# Patient Record
Sex: Female | Born: 1955 | Race: White | Hispanic: No | Smoking: Never smoker
Health system: Southern US, Community
[De-identification: ages and names within clinical notes are randomized; demographics above are authoritative.]

## PROBLEM LIST (undated history)

## (undated) DIAGNOSIS — F32A Depression, unspecified: Secondary | ICD-10-CM

## (undated) DIAGNOSIS — I38 Endocarditis, valve unspecified: Secondary | ICD-10-CM

## (undated) DIAGNOSIS — G250 Essential tremor: Secondary | ICD-10-CM

## (undated) DIAGNOSIS — E079 Disorder of thyroid, unspecified: Secondary | ICD-10-CM

## (undated) DIAGNOSIS — F329 Major depressive disorder, single episode, unspecified: Secondary | ICD-10-CM

## (undated) DIAGNOSIS — M3500 Sicca syndrome, unspecified: Secondary | ICD-10-CM

## (undated) DIAGNOSIS — E785 Hyperlipidemia, unspecified: Secondary | ICD-10-CM

## (undated) DIAGNOSIS — G4700 Insomnia, unspecified: Secondary | ICD-10-CM

## (undated) DIAGNOSIS — F419 Anxiety disorder, unspecified: Secondary | ICD-10-CM

## (undated) HISTORY — PX: KNEE SURGERY: SHX244

---

## 2009-12-23 ENCOUNTER — Encounter: Admission: RE | Admit: 2009-12-23 | Discharge: 2009-12-23 | Payer: Self-pay | Admitting: Unknown Physician Specialty

## 2013-08-16 ENCOUNTER — Encounter: Payer: Self-pay | Admitting: *Deleted

## 2013-08-16 ENCOUNTER — Emergency Department (INDEPENDENT_AMBULATORY_CARE_PROVIDER_SITE_OTHER): Payer: BC Managed Care – PPO

## 2013-08-16 ENCOUNTER — Emergency Department
Admission: EM | Admit: 2013-08-16 | Discharge: 2013-08-16 | Disposition: A | Discharge status: Home / Self Care | Payer: BC Managed Care – PPO | Source: Home / Self Care | Attending: Family Medicine | Admitting: Family Medicine

## 2013-08-16 DIAGNOSIS — M7521 Bicipital tendinitis, right shoulder: Secondary | ICD-10-CM

## 2013-08-16 DIAGNOSIS — M7551 Bursitis of right shoulder: Secondary | ICD-10-CM

## 2013-08-16 DIAGNOSIS — M7520 Bicipital tendinitis, unspecified shoulder: Secondary | ICD-10-CM

## 2013-08-16 DIAGNOSIS — M67919 Unspecified disorder of synovium and tendon, unspecified shoulder: Secondary | ICD-10-CM

## 2013-08-16 DIAGNOSIS — M25519 Pain in unspecified shoulder: Secondary | ICD-10-CM

## 2013-08-16 HISTORY — DX: Hyperlipidemia, unspecified: E78.5

## 2013-08-16 MED ORDER — MELOXICAM 7.5 MG PO TABS: 7.5000 mg | ORAL_TABLET | Freq: Every day | ORAL | Status: DC

## 2013-08-16 NOTE — ED Provider Notes (Signed)
CSN: [id number]     Arrival date & time 08/16/13  1655 History   First MD Initiated Contact with Patient 08/16/13 1739     Chief Complaint  Patient presents with  . Shoulder Pain      HPI Comments: Patient has a history of right shoulder pain that flares up every several years.  In the past she has undergone steroid injection and range of motion exercises with improvement.  Her right shoulder pain recurred about two months ago, somewhat worse than in the past.   She recalls no recent trauma to her shoulder.  She is a Financial controller for 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  She is right-handed. Her pain has not responded to Aleve.  Patient is a 57 y.o. female presenting with shoulder pain. The history is provided by the patient.  Shoulder Pain This is a recurrent problem. Episode onset: 2 months ago. The problem occurs constantly. The problem has been gradually worsening. Associated symptoms comments: none. Exacerbated by: reaching overhead. Nothing relieves the symptoms. Treatments tried: Aleve. The treatment provided mild relief.    Past Medical History  Diagnosis Date  . Hyperlipidemia    Past Surgical History  Procedure Laterality Date  . Knee surgery     History reviewed. No pertinent family history. History  Substance Use Topics  . Smoking status: Never Smoker   . Smokeless tobacco: Never Used  . Alcohol Use: Yes   OB History   Grav Para Term Preterm Abortions TAB SAB Ect Mult Living                 Review of Systems  All other systems reviewed and are negative.    Allergies  Review of patient's allergies indicates no known allergies.  Home Medications   Current Outpatient Rx  Name  Route  Sig  Dispense  Refill  . aspirin 81 MG tablet   Oral   Take 81 mg by mouth daily.         Marland Kitchen buPROPion (WELLBUTRIN) 75 MG tablet   Oral   Take 75 mg by mouth 2 (two) times daily.         .  metoprolol tartrate (LOPRESSOR) 25 MG tablet   Oral   Take 25 mg by mouth 2 (two) times daily.         Marland Kitchen venlafaxine (EFFEXOR) 25 MG tablet   Oral   Take 25 mg by mouth 2 (two) times daily.         . meloxicam (MOBIC) 7.5 MG tablet   Oral   Take 1 tablet (7.5 mg total) by mouth daily. Take with food.   15 tablet   0    BP 128/94  Pulse 67  Resp 16  Ht 5\' 4"  (1.626 m)  Wt 149 lb (67.586 kg)  BMI 25.56 kg/m2  SpO2 96% Physical Exam  Nursing note and vitals reviewed. Constitutional: She is oriented to person, place, and time. She appears well-developed and well-nourished. No distress.  HENT:  Head: Normocephalic.  Eyes: Conjunctivae are normal. Pupils are equal, round, and reactive to light.  Neck:  Mild right trapezius tenderness present.  Cardiovascular: Normal heart sounds.   Pulmonary/Chest: Breath sounds normal.  Musculoskeletal:       Right shoulder: She exhibits tenderness. She exhibits normal range of motion, no bony tenderness, no swelling, no crepitus and normal strength.  Patient has generally good range of motion in right  shoulder.  She is able to perform Apley's test slowly.  Good external/internal rotational strength.  Negative empty can test.   There is distinct tenderness over insertion of biceps tendons.  There is tenderness over the sub-acromial bursa.  Hawkin's and Neer's test positive. Distal neurovascular function is intact.   Lymphadenopathy:    She has no cervical adenopathy.  Neurological: She is alert and oriented to person, place, and time.  Skin: Skin is warm and dry. No rash noted.    ED Course  Procedures      Imaging Review Dg Shoulder Right  08/16/2013   *RADIOLOGY REPORT*  Clinical Data: Persistent right shoulder pain for 2 months, intermittently for years, no history of trauma  RIGHT SHOULDER - 2+ VIEW  Comparison: None  Findings: Osseous mineralization grossly normal. Joint spaces preserved. No acute fracture, dislocation, or bone  destruction. Visualized right ribs unremarkable.  IMPRESSION: No acute osseous abnormalities.   Original Report Authenticated By: Ulyses Southward, M.D.    MDM   1. Shoulder bursitis, right   2. Biceps tendonitis, right    Begin Mobic 7.5mg  daily (if this is ineffective after several days, may increase to two daily and switch to Rx for 15mg ) Apply ice pack two or three times daily.  Begin range of motion and stretching exercises as per instruction sheets ([facility] information and instruction handout given)   May take Tylenol at bedtime for pain Followup with Dr. Rodney Langton in two weeks.    Lattie Haw, MD 08/16/13 [phone]

## 2013-08-16 NOTE — ED Notes (Signed)
Pt c/o right shoulder pain x 2 months without injury. She is a Financial controller. Hx of right shoulder pain, never had x-ray. Pain only with activity.

## 2013-08-17 ENCOUNTER — Telehealth: Payer: Self-pay | Admitting: *Deleted

## 2013-08-31 ENCOUNTER — Institutional Professional Consult (permissible substitution): Payer: BC Managed Care – PPO | Admitting: Sports Medicine

## 2014-01-24 ENCOUNTER — Ambulatory Visit (INDEPENDENT_AMBULATORY_CARE_PROVIDER_SITE_OTHER): Payer: 59 | Admitting: Psychiatry

## 2014-01-24 ENCOUNTER — Encounter (INDEPENDENT_AMBULATORY_CARE_PROVIDER_SITE_OTHER): Payer: Self-pay

## 2014-01-24 DIAGNOSIS — F321 Major depressive disorder, single episode, moderate: Secondary | ICD-10-CM

## 2014-01-24 DIAGNOSIS — F411 Generalized anxiety disorder: Secondary | ICD-10-CM | POA: Insufficient documentation

## 2014-01-24 NOTE — Progress Notes (Signed)
   THERAPIST PROGRESS NOTE  Session Time: 10:20 - 11:00 am   Participation Level: Active  Behavioral Response: Casual and NeatAlertAnxious and Depressed  Type of Therapy: Individual Therapy  Treatment Goals addressed: Initial Therapy session and start of psychosocial assessment  Interventions: Started initial assessment.   Summary: Tamara CreteKarianne Caccavale is a 58 y.o. female who presents with severe anxiety and moderate depression symptoms. This is Pts first session with Clinical research associatewriter. Pt arrived twenty minutes late for the session and stated "I am always late". Pt speech and thoughts were accelerated and she required redirection to remain focused and on topic. Pt said she was referred for counseling by her doctor at [facility] (Dr. 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 Pt indicated that she had a "stressful year in 2013 where their were four different deaths of close acquaintances". Pt said they were all younger people that should not have died. Pt said in 2014 her mother died and just prior to her death her bother (age 58) committed suicide through gun shot before they had to make the decision to turn of their mothers life support. Pt was tearful as she talked about it. Pt said she has very high anxiety and rated it Anxiety- 10/10 (0=no anxiety; 5= moderate/tolerable anxiety; 10= panic attacks) and rated her current Depression: 5/10 (0=Very depressed; 5=Neutral; 10=Very Happy). Pt denied thoughts of suicide and said "I could never do that to my father". Pt agreed to come back next week to complete her assessment and asked to have her appointments scheduled thirty minutes prior to the actual start time to ensure she would be on time due to her chronic tardiness. Pt requested writer complete her leave paperwork for her job but due to Pt coming late and the full assessment not being  completed, Clinical research associatewriter did not have enough information to complete the forms. Pt said that since the forms are due this week, she will contact [facility] to see if they can complete them since they have Pts full history.   Suicidal/Homicidal: No  Therapist Response: Introduced self to pt, oriented Pt to the therapeutic process, reviewed no show and cancellation policy, completed a safety assessment to determine no SI, HI or AVH and began psychosocial assessment.   Plan: Return again in 1 week. Finish initial assessment.  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

## 2014-02-01 ENCOUNTER — Ambulatory Visit (INDEPENDENT_AMBULATORY_CARE_PROVIDER_SITE_OTHER): Payer: 59 | Admitting: Psychiatry

## 2014-02-01 DIAGNOSIS — F411 Generalized anxiety disorder: Secondary | ICD-10-CM

## 2014-02-01 DIAGNOSIS — F321 Major depressive disorder, single episode, moderate: Secondary | ICD-10-CM

## 2014-02-02 ENCOUNTER — Telehealth (HOSPITAL_COMMUNITY): Payer: Self-pay | Admitting: Psychiatry

## 2014-02-02 NOTE — Telephone Encounter (Signed)
Writer returned Pts call inquiring about name of the psychiatrist she will be seeing. Pt requests to be put on a cancellation list for him (Dr. Laury DeepPuthuvel) in case their is an earlier appointment than her current scheduled appointment in March. Writer said she would give this request to the front desk staff for follow up.

## 2014-02-02 NOTE — Progress Notes (Signed)
  [facility] Biopsychosocial Assessment  Tamara Cochran 58 y.o. 02/02/2014    Referred by: Dr. Sharee PimpleJudge, PCP  PRESENTING PROBLEM Chief Complaint:  Pt is a 58 year old, Caucasian, employed, divorced, female, referred for counseling by her PCP, Dr. Sharee PimpleJudge for the treatment of anxiety and depression following several deaths over the past two years including the recent death of her mother suicide of her brother in 2014. Pt said her anxiety began increasing during 2013-2014 with numerous deaths of family acquaintances. Pt said "I was attending funeral after funeral" and all of the losses were unnatural losses of younger individuals. Pt then lost her mother due to medical reasons and her brother to suicide in 2014. Pt said it all became "too much". Pt started calling in to work due to not feeling able to travel or function in the stressful environment and sought help from her primary care doctor.  Pt is unmarried without any children and denies and previous mental health history in either an inpatient or outpatient setting. Pts goals are to learn how to manage her anxiety so she can return to work next month.   What are the main stressors in your life right now, how long?  Anxiety, Depression etc. short answers  PREVIOUS MENTAL  HEALTH SERVICES Pt denies  RISK FACTORS FOR SUCIDE Demographic factors: Lives alone, unmarried, no children Current mental status: no suicidal ideation  Loss factors: several deaths including her mother and brother in 2014.  Historical factors: None reported  Risk Reduction factors: Desire to get better and return to her job  Clinical factors: Moderate depression and anxiety  Cognitive features that contribute to risk:  SUICIDE RISK: Minimal: No identifiable suicidal ideation. Patients presenting with no risk factors and  may be classified as minimal risk based on the severity of the depressive symptoms   MEDICAL HISTORY Pt denies any significant medical  history  SOCIAL/FAMILY HISTORY Pt states she was divorced from her only husband in 1999, but they remain friends and the divorce was amicable. Pt said she never had any children. [place].   EDUCATION Some college with a focus in early childhood and family relationships.   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   LEGAL HISTORY Pt denies  SUBSTANCE USE Pt denies  MENTAL STATUS General Appearance /Behavior:  neat, well groomed, friendly Eye Contact: Good Motor Behavior: WNL Speech: normal rate, volume  Level of Consciousness:  WNL Mood: Anxious, depressed Affect: wide range of affect Anxiety Level: moderate Thought Process: Clear, coherent, Thought Content: Normal Perception: Good Judgment: Good Insight: Good Cognition: WNL  DIAGNOSIS  AXIS I  Generalized Anxiety Disorder, Depression, Major, Single Episode, Moderate   PLAN Oriented Pt to the therapeutic process and discussed therapeutic expectations. Pt agrees to weekly counseling to address symptoms of anxiety and depression.  Writer completed Pts FMLA paperwork. Pt is to return to work at the end of March.    Tamara Cochran,Tamara Galla E, LCSW 02/02/2014

## 2014-02-07 ENCOUNTER — Ambulatory Visit (INDEPENDENT_AMBULATORY_CARE_PROVIDER_SITE_OTHER): Payer: 59 | Admitting: Psychiatry

## 2014-02-07 DIAGNOSIS — F411 Generalized anxiety disorder: Secondary | ICD-10-CM

## 2014-02-07 DIAGNOSIS — F321 Major depressive disorder, single episode, moderate: Secondary | ICD-10-CM

## 2014-02-07 NOTE — Progress Notes (Signed)
THERAPIST PROGRESS NOTE  Session Time: 3:00-3:50 pm  Participation Level: Active  Behavioral Response: CasualAlertDepressed  Type of Therapy: Individual Therapy  Treatment Goals addressed: Created treatment plan goals  Interventions: Created treatment plan goals.   Summary:    INDIVIDUAL TREATMENT PLAN       Date of Admission:  01-24-14 Date of Treatment Plan: 02-07-14     Service: [x]  Group  [x]  Individual [x]  Comprehensive []  Revised due to: []  Change in Diagnosis     []  Change in Service     []  Expiration of Previous Treatment Plan  Diagnosis:  Generalized Anxiety Disorder  Recommended Treatment:  [x]  Individual Therapy  []  Family Therapy  [x]  Couples Therapy  [x]  Chemical Dependency (CD-IOP) group therapy 3x weekly and 1:1 individual therapy as required  []  Mental Health (MH-IOP) group therapy 5x weekly and 1:1 individual therapy as required   Possible Negative Outcomes of Treatment: Symptoms of mental illness may increase and changes in relationship can occur during the course of treatment.  Client's Strengths (What are the strengths and resources that will help clients move towards their goals):  Motivated, determined, stable housing and finances   Personal Recovery Goal(s)/Client's Goals for Treatment (use client's words):  To grieve the death of brother and mange symptoms of anxiety and depression.     Objective Behavioral Criteria for Discharge: Client will maintain stability in the community as demonstrated by the following:   No suicidal behaviors for 3 months.   No hospitalizations or emergency room visits for psychological reasons for 3 months.   No SIB behaviors requiring medical attention for 3 months.   Emotional regulation by reporting distress at an average of 5/10 or below for 3 months.   Demonstration of healthy community supports by initiating peer contact at least twice weekly separate from the treatment environment.   Agreed-upon  transition plan to a less restrictive setting.  INFORMED CONSENT TO TREATMENT. I acknowledge that I have been informed of and am able to understand my diagnosis, the nature and purpose of the proposed treatment, the risks and benefits of the proposed treatment, the possible negative outcomes of and possible alternatives to the proposed treatment, the probability that the proposed treatment will be successful, and the prognosis if I choose not to receive the treatment. Further, I have been informed of the extent and limits of confidentiality of treatment information. I understand the risks, benefits, possible negative outcomes of and alternatives to this proposed treatment, and my signature below verifies that I have actively participated in the development of my treatment plan and that I am willingly and voluntarily agreeing to the treatment outlined in this plan Assessed Needs (Problem) Client's Goals (what client will do)   Interventions (what staff will do)   1. Depression    o Have improved mood and return to a healthier level of functioning.   o Identify the causes for depressed mood and learn ways to cope with depression.    o Explore how depression is experienced in day-to-day living; encourage sharing of feelings of depression to gain insight into causes and create a coping plan to manage depression symptoms. o Provide education about depression to help patient identify causes, symptoms and triggers. o Teach and encourage the use of coping skills for management of depression symptoms.  o Teach WRAP skills for depression management.     2. Anxiety     o Improve ability to manage anxiety symptoms and better handle stress.  o Manage thoughts and  worrisome thinking that contribute to feelings of anxiety.  o Complete anxiety assessment (Burns Anxiety Inventory) to explore severity of symptoms. o Provide education about anxiety to help patient identify causes, symptoms and  triggers. o Facilitate problem solution skills to help patient identify and implement options to resolve stressors.  o Teach coping skills to manage anxiety symptoms such as; biofeedback, guided imagery, meditation and aromatherapy. o Use CBT to identify and change anxiety provoking thought patterns.    3. Brothers suicide    o Learn ways to grieve brothers suicide  o Process grief around loss of brother. o Encourage the telling of Pts grief story about his wife. o Identify and educate on the different stages of grief. o Encourage the Pt to bring pictures and memorabilia to help process feelings. o Process feelings of guilt or blame associated with the loss.   I have [] received [] declined a copy of this treatment plan.  Client: Date: Guardian/Parent: Date:   Therapist: Date: Licensed Provider (if applicable): Date:    Six month review:   I have reviewed this treatment plan and consider it still valid. Client: Date: Guardian/Parent: Date:      Suicidal/Homicidal: no  Therapist Response: Assessed overall level of functioning and created treatment plan goals.   Plan: Return again in 1 weeks.  Diagnosis: Axis I: Generalized Anxiety disorder and Major Depression single episode moderate      Tamara Cochran E, LCSW 02/07/2014

## 2014-02-14 ENCOUNTER — Ambulatory Visit (INDEPENDENT_AMBULATORY_CARE_PROVIDER_SITE_OTHER): Payer: 59 | Admitting: Psychiatry

## 2014-02-14 DIAGNOSIS — F411 Generalized anxiety disorder: Secondary | ICD-10-CM

## 2014-02-14 NOTE — Progress Notes (Signed)
   THERAPIST PROGRESS NOTE  Session Time: 4:00-4:50 pm  Participation Level: Active  Behavioral Response: CasualAlertAnxious  Type of Therapy: Individual Therapy  Treatment Goals addressed: Anxiety  Interventions: CBT and Other: Boundaries skills  Summary: Tamara Cochran is a 58 y.o. female who presents with moderate anxiety with both mood and affect. Pt reports getting to her appointment an hour early on accident due to her scattered thinking and feeling disorganized. Pt said she continues to lose things, forget appointments and make mistakes due to her high anxiety. Pt said this is why she does not like leaving her house because she feels less can go wrong when she stays home. Pt said today was a "nightmare" trying to organize her time and all he appointments. Pt gave examples of interactions with friends and family where she has not limits with how she allows others to treat her or take her time. Pt said she spent three hours on the phone with a friend the other day which took her time away from things she needed to do and caused increased anxiety but she was not able to set any limits with the friend. Pt said she tries to please everyone and this is what makes her exhausted and frustrated.  She said she feels mean if she tells people her needs and that comes from her upbringing. Pt was willing to explore ares of her life she feels lacks boundaries and agrees to explore more areas over the next week by evaluating what situations cause her stress to share in the next session.    Suicidal/Homicidal: No  Therapist Response: Assessed overall level of anxiety per Pt self report and behaviors during session. Educated PT on the need for healthier boundaries with others and the lack thereof causing her high anxiety due to no structure in her life. Drew a picture to represent the lack of boundaries. Assigned homework of identifying stressful situations and asking the question "how does this affect me".  Educated Pt about Biofeedback to introduce in the next session.   Plan: Return again in 1 week. Follow up on boundaries homework. Educate on the different personality types and introduce to biofeedback.   Diagnosis: Axis I: Generalized Anxiety Disorder      Carman ChingGLEHORN,Tyke Outman E, LCSW 02/14/2014

## 2014-02-21 ENCOUNTER — Ambulatory Visit (INDEPENDENT_AMBULATORY_CARE_PROVIDER_SITE_OTHER): Payer: 59 | Admitting: Psychiatry

## 2014-02-21 DIAGNOSIS — F321 Major depressive disorder, single episode, moderate: Secondary | ICD-10-CM

## 2014-02-21 DIAGNOSIS — F411 Generalized anxiety disorder: Secondary | ICD-10-CM

## 2014-02-21 NOTE — Progress Notes (Signed)
   THERAPIST PROGRESS NOTE  Session Time: 3:00-3:50 pm  Participation Level: Active  Behavioral Response: CasualAlertAnxious  Type of Therapy: Individual Therapy  Treatment Goals addressed: Anxiety  Interventions: CBT and Other: Boundary Skills  Summary: Tamara CreteKarianne Wooden is a 58 y.o. female who presents with severe anxious mood and affect. Pts thoughts were racing and scattered and she struggled to stay on one topic. Pt expressed concerns at the start of the session about her insurance not covering past visits and said she was working on addressing that with the receptionist today. Pt said she followed upon her homework of exploring situations where healthier boundaries need to be set. Pt have numerous examples of how she has no limits with other people and it is causing her great anxiety. Pt said her neighbors, who are having marital conflict, are using Pt as their counselor to talk about each other. Pt said it is to the point she hides in her home, leaves the garage door down to send the message she is not home and avoids their texts and phone calls. Pt said this is the man she as on the phone with for there hours that she talked about in the last session. During session Pts phone range several times indicating texts from his neighbor. Pt said she struggles with setting limits with others because she has an immense need to be liked by others and feels she is being mean if she sets limits with them. Pt said she also struggles when others set limits with her. Pt said she gets anxious if she sends a text to a friend and they don't respond right away. Pt said her mind starts racing with worries that they don't like her anymore. Pt agreed to make boundaries work her primary focus on Tx given this appears to be Pts primary source of anxiety.   Suicidal/Homicidal: No  Therapist Response: Assessed overall level of anxiety per Pt self report and behaviors during session. Reviewed boundaries homework from  last week. Explored situations that were anxiety provoking that occurred this past week where Pt was uncomfortable setting limits with others. Educated Pt on the need for boundaries as they pertain to anxiety management and practiced identifying ways to begin setting limits in each situation, challenged Pts distorted thinking interfering with healthy boundary setting and assigned homework of reading over the four personality styles to discuss at next weeks session.   Plan: Return again in 1 week. Log Pt onto biofeedback program just to show her anxiety level at baseline. Continue working in teaching healthy bondaries. Educate on the five ways to set limits with others and the common myths about boundary setting.  Diagnosis: Axis I: Generalized Anxiety Disorder      Carman ChingGLEHORN,Kiron Osmun E, LCSW 02/21/2014

## 2014-02-28 ENCOUNTER — Ambulatory Visit (HOSPITAL_COMMUNITY): Payer: Self-pay | Admitting: Psychiatry

## 2014-03-01 ENCOUNTER — Telehealth (HOSPITAL_COMMUNITY): Payer: Self-pay

## 2014-03-07 ENCOUNTER — Ambulatory Visit (HOSPITAL_COMMUNITY): Payer: Self-pay | Admitting: Psychiatry

## 2014-03-13 ENCOUNTER — Encounter (HOSPITAL_COMMUNITY): Payer: Self-pay | Admitting: Psychiatry

## 2014-03-13 ENCOUNTER — Ambulatory Visit (HOSPITAL_COMMUNITY): Payer: Self-pay | Admitting: Psychiatry

## 2014-03-13 NOTE — Progress Notes (Signed)
Patient ID: Tamara CreteKarianne Vilar, female   DOB: 05/06/1956, 58 y.o.   MRN: [id number] Pt called this morning to cancel her 9:00 am appointment saying she "overslept". This is Pts first time missing a therapy appointment with Clinical research associatewriter. Pt was informed of the office attendance policy.

## 2014-03-14 ENCOUNTER — Ambulatory Visit (HOSPITAL_COMMUNITY): Payer: Self-pay | Admitting: Psychiatry

## 2014-03-20 ENCOUNTER — Telehealth (HOSPITAL_COMMUNITY): Payer: Self-pay

## 2014-03-21 ENCOUNTER — Ambulatory Visit (HOSPITAL_COMMUNITY): Payer: Self-pay | Admitting: Psychiatry

## 2014-03-26 ENCOUNTER — Telehealth (HOSPITAL_COMMUNITY): Payer: Self-pay | Admitting: Psychiatry

## 2014-03-26 ENCOUNTER — Telehealth (HOSPITAL_COMMUNITY): Payer: Self-pay

## 2014-03-26 NOTE — Telephone Encounter (Signed)
Writer returned Pts call and left a message for her to call back due to no answer.

## 2014-03-27 NOTE — Telephone Encounter (Signed)
Writer left a message on Pts voicemail stating call was returned and for Pt to feel free to call back or writer would see Pt tomorrow at her scheduled therapy appointment.

## 2014-03-28 ENCOUNTER — Encounter (INDEPENDENT_AMBULATORY_CARE_PROVIDER_SITE_OTHER): Payer: Self-pay

## 2014-03-28 ENCOUNTER — Ambulatory Visit (INDEPENDENT_AMBULATORY_CARE_PROVIDER_SITE_OTHER): Payer: 59 | Admitting: Psychiatry

## 2014-03-28 DIAGNOSIS — F411 Generalized anxiety disorder: Secondary | ICD-10-CM

## 2014-03-28 NOTE — Progress Notes (Signed)
   THERAPIST PROGRESS NOTE  Session Time: 3:00-3:50 pm  Participation Level: Active  Behavioral Response: Casual, Neat and Well GroomedAlertAnxious  Type of Therapy: Individual Therapy  Treatment Goals addressed: Anxiety  Interventions: Other: letter writing   Summary: Tamara Cochran is a 58 y.o. female who presents with anxious mood and affect. The entire session was used to complete an FMLA paperwork update per Pts request. Pt said her forms had expired and she is not feeling ready to return to work due to high anxiety and panic symptoms. Pt was informed that writer would not be providing any further extensions due to going out on maternity leave and that further paperwork would need to be handled by a psychiatrist or doctor. See below for letter that was completed and faxed during session.        Date: March 23, 2014  RE: Tamara Cochran DOB: June 29, 1956 Diagnosis: Anxiety Disorder Fax: [phone]   To Whom It May Concern:  This letter is in regards to Tamara Cochran and her current diagnosis of Anxiety Disorder. She is currently receiving treatment for this condition through the [facility] and is under the medical care of Maxcine HamShannon Sonny Anthes, MSW, LCSW.   Common symptoms of Anxiety Disorder include; difficulty breathing, pounding heart or chest pain, Intense feeling of dread, sensation of choking or smothering, dizziness or feeling faint ,trembling or shaking, sweating, nausea or stomachache, tingling or numbness in the fingers and toes, chills or hot flashes and a fear that the individual is losing control or are about to die.  We are requesting an extension on her FMLA paperwork through May 13, 2014 due to the continued severity of her anxiety symptoms. Thank you so much for your cooperation in this matter.   Sincerely,   Maxcine HamShannon Jhade Berko, MSW, LCSW   ** If you require additional information, please contact the above patient to obtain a written authorization  to release protected health information. Art gallery managerederal regulations and state privacy laws do not permit release of information without a signed authorization. The patient may stop by the Hospital to complete an Authorization to Release Protected Health Information.  Thank you for your cooperation in this regard.     Suicidal/Homicidal:  No  Therapist Response: Assessed overall level of anxiety per Pt self report, completed a letter for an FMLA extension and faxed letter. Gave Pt original copy of successful fax transmission sheet and letter.   Plan: Return again in 1 weeks.  Diagnosis: Axis I: Generalized Anxiety Disorder       Carman ChingGLEHORN,Acacia Latorre E, LCSW 03/28/2014

## 2014-04-04 ENCOUNTER — Ambulatory Visit (INDEPENDENT_AMBULATORY_CARE_PROVIDER_SITE_OTHER): Payer: 59 | Admitting: Psychiatry

## 2014-04-04 DIAGNOSIS — F411 Generalized anxiety disorder: Secondary | ICD-10-CM

## 2014-04-04 DIAGNOSIS — F321 Major depressive disorder, single episode, moderate: Secondary | ICD-10-CM

## 2014-04-04 NOTE — Progress Notes (Signed)
   THERAPIST PROGRESS NOTE  Session Time: 3:00-3:50 pm  Participation Level: Active  Behavioral Response: CasualAlertDepressed  Type of Therapy: Individual Therapy  Treatment Goals addressed: Brothers suicide  Interventions: Supportive and Other: grief and loss counseling  Summary: Anner CreteKarianne Mcgurn is a 58 y.o. female who presents with mild depressed mood and affect in relationship to her brothers suicide. Pt said his birthday was last week and it was a very sad and difficult day for her that she spent thinking of him a lot. Pt said she had thought about cancelling her appointment with writer today because she has been feeling a decrease in motivation and has been socially isolating away from others. Pt processed feelings regarding his death and discussed the suicide note he left behind. Pt said her birthday is next week and she is concerned about how that day will be for her because they always celebrated their birthday together. Pt agreed to discuss this and plan for it at the next session next week. Pt also explored ways to g[place] her [place] [facility] with a [place].    Suicidal/Homicidal: No  Therapist Response: Assessed overall level of depression per Pt self report, encouraged consistent attendance to manage symptoms, provided grief and loss counseling pertaining to her brothers death, processed feelings regarding his loss and educated on symptoms of depression.   Plan: Return again in 1 weeks. Plan for Pts birthday and how to manage grief on that day pertaining to the death of her brother.   Diagnosis: Axis I: Generalized Anxiety Disorder        Carman ChingGLEHORN,Aaban Griep E, LCSW 04/04/2014

## 2014-04-11 ENCOUNTER — Ambulatory Visit (INDEPENDENT_AMBULATORY_CARE_PROVIDER_SITE_OTHER): Payer: 59 | Admitting: Psychiatry

## 2014-04-11 DIAGNOSIS — F321 Major depressive disorder, single episode, moderate: Secondary | ICD-10-CM

## 2014-04-11 DIAGNOSIS — F411 Generalized anxiety disorder: Secondary | ICD-10-CM

## 2014-04-11 NOTE — Progress Notes (Signed)
   THERAPIST PROGRESS NOTE  Session Time: 3:00-3:50 pm  Participation Level: Active  Behavioral Response: Neat and Well GroomedAlertDepressed  Type of Therapy: Individual Therapy  Treatment Goals addressed: Depression and Brothers suicide  Interventions: Other: grief and loss counseling  Summary: Tamara Cochran is a 58 y.o. female who presents with mild depressed mood and affect as Pt talked about her brothers suicide. Pt said she is worried about the month of May due to so many painful first anniversaries after the death of her brother. Pt said her birthday is this Friday an she is worried about how she will feel that day since her and her brother Tamara Loa(Tim) always celebrated their birthdays together. Pt processed feelings associated with her brothers death and described his first suicide attempt he had six weeks prior to his death where his wife found him with a gun in his pocket with plans to go out to the shed and shoot himself. Pt said [facility] and was released. Pt described feelings of loss associated with losing him. Pt also brainstormed how she will grieve and receive support this Friday. Pt agreed to Higher education careers advisercontact writer if symptoms worsen.  Pt also took information on writers maternity leave to determine if she wants a Veterinary surgeoncounselor while Clinical research associatewriter is on maternity leave.   Suicidal/Homicidal:No  Therapist Response: Assessed overall level of depression per Pt self report and provided grief and loss counseling over her brothers suicide. Planned for writers maternity leave.   Plan: Return again in 1 weeks.  Diagnosis: Axis I: Generalized Anxiety Disorder       Carman ChingGLEHORN,Aracely Rickett E, LCSW 04/11/2014

## 2014-04-13 ENCOUNTER — Ambulatory Visit (HOSPITAL_COMMUNITY): Payer: Self-pay | Admitting: Psychiatry

## 2014-04-18 ENCOUNTER — Ambulatory Visit (INDEPENDENT_AMBULATORY_CARE_PROVIDER_SITE_OTHER): Payer: 59 | Admitting: Psychiatry

## 2014-04-18 DIAGNOSIS — F321 Major depressive disorder, single episode, moderate: Secondary | ICD-10-CM

## 2014-04-18 DIAGNOSIS — F411 Generalized anxiety disorder: Secondary | ICD-10-CM

## 2014-04-18 NOTE — Progress Notes (Signed)
   THERAPIST PROGRESS NOTE  Session Time: 3:00-3:50 pm  Participation Level: Active  Behavioral Response: Neat and Well GroomedAlertEuthymic  Type of Therapy: Individual Therapy  Treatment Goals addressed: Depression and Anxiety  Interventions: CBT and Supportive  Summary: Tamara CreteKarianne Corpening is a 58 y.o. female who presents with elevated mood and affect. Pt denied any active depression or anxiety symptoms over the course of the past week. Pt said she had an ok birthday and was able to manage the grief and loss over the celebrating her first birthday without her brother. Pt processed feelings about her birthday and the loss of him.   Suicidal/Homicidal: No   Therapist Response: Assessed overall level of functioning per Pt self report, Processed grief and loss over her brothers death and Pts birthday.   Plan: Return again in 1 weeks.  Diagnosis: Axis I: Generalized Anxiety disorder       Carman ChingGLEHORN,Alida Greiner E, LCSW 04/18/2014

## 2014-04-25 ENCOUNTER — Ambulatory Visit (HOSPITAL_COMMUNITY): Payer: Self-pay | Admitting: Psychiatry

## 2014-05-02 ENCOUNTER — Encounter (INDEPENDENT_AMBULATORY_CARE_PROVIDER_SITE_OTHER): Payer: Self-pay

## 2014-05-02 ENCOUNTER — Ambulatory Visit (INDEPENDENT_AMBULATORY_CARE_PROVIDER_SITE_OTHER): Payer: 59 | Admitting: Psychiatry

## 2014-05-02 DIAGNOSIS — F411 Generalized anxiety disorder: Secondary | ICD-10-CM

## 2014-05-02 NOTE — Progress Notes (Signed)
   THERAPIST PROGRESS NOTE  Session Time: 3:20-4:00 pm  Participation Level: Minimal  Behavioral Response: CasualConfusedAnxious  Type of Therapy: Individual Therapy  Treatment Goals addressed: Anxiety  Interventions: CBT  Summary: Tamara Cochran is a 58 y.o. female who presents twenty minutes late for the session because she was providing phone counseling to her ex-boyfriend and was unable to tell him she needed to hang up the phone and attend her counseling appointment. Pt said it does not matter if she comes on time to the appointment because "writer does not care if Pt comes on time". Pt started the session by saying she did not feel the counseling sessions were working to manage her anxiety and she was not making any progress.  Pt acknowledged that she lacks boundaries for herself that contribute to anxiety, but said it is too hard to change her behaviors because she feels guilty and does not like people angry with her so she has not been making the changes in her life that writer has been suggesting for anxiety reduction. Pts thoughts were disorganized and speech was pressured. Pt said she is not sleeping and believes it is because her Ambien is causing severe dreams that are keeping her awake. Pt said she did not call the doctor to inform them of this and made several excuses as to why that would not work. When Pt was confronted on her negative thought process and how she was listed all the reasons she can't get well and things won't work, Pt seemed confused and struggled to participate in active problem solving. Pt said she feels worse off from work because she can't set healthy limits with friends who need things from her and work provided her the structure in her life she can't provide herself. Pt is scheduled to see the psychiatrist on June 2 and to return to work at the same time. Pt agreed to call her doctor to discuss sleep medication and agreed to come on time for her next counseling  session.   Suicidal/Homicidal: No  Therapist Response: Assessed overall level of anxiety, challenged Pts lateness and linked her lack of personal responsibility in attending and participating in her therapy to the reason she does nto feel she is making progress in her treatment, educated on healthy boundary setting that continues to cause stress in her life, encouraged Pt to return to work since her anxiety was improved while she had the structure of working, encouraged continuing to work with a counseling on boundary setting and structure as a main source of Primary school teacheranxiety management.   Plan: Return again in 1 week.  Diagnosis: Axis I: Generalized Anxiety Disorder        Carman ChingGLEHORN,Tamara Alkhatib E, LCSW 05/02/2014

## 2014-05-09 ENCOUNTER — Ambulatory Visit (HOSPITAL_COMMUNITY): Payer: Self-pay | Admitting: Psychiatry

## 2014-05-15 ENCOUNTER — Encounter (INDEPENDENT_AMBULATORY_CARE_PROVIDER_SITE_OTHER): Payer: Self-pay

## 2014-05-15 ENCOUNTER — Ambulatory Visit (INDEPENDENT_AMBULATORY_CARE_PROVIDER_SITE_OTHER): Payer: 59 | Admitting: Psychiatry

## 2014-05-15 DIAGNOSIS — F411 Generalized anxiety disorder: Secondary | ICD-10-CM

## 2014-05-15 DIAGNOSIS — F329 Major depressive disorder, single episode, unspecified: Secondary | ICD-10-CM

## 2014-05-15 DIAGNOSIS — F321 Major depressive disorder, single episode, moderate: Secondary | ICD-10-CM

## 2014-05-15 MED ORDER — BUPROPION HCL 100 MG PO TABS: 200.0000 mg | ORAL_TABLET | Freq: Every morning | ORAL | Status: DC

## 2014-05-15 MED ORDER — VENLAFAXINE HCL 25 MG PO TABS: 25.0000 mg | ORAL_TABLET | Freq: Two times a day (BID) | ORAL | Status: AC
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
## 2014-05-16 ENCOUNTER — Other Ambulatory Visit (HOSPITAL_COMMUNITY): Payer: Self-pay | Admitting: Psychiatry

## 2014-05-16 MED ORDER — BUPROPION HCL 100 MG PO TABS: 100.0000 mg | ORAL_TABLET | Freq: Every morning | ORAL | Status: DC

## 2014-05-16 MED ORDER — BUPROPION HCL ER (XL) 300 MG PO TB24: 300.0000 mg | ORAL_TABLET | ORAL | Status: DC

## 2014-05-16 NOTE — Progress Notes (Signed)
Patient called and informed she is already on 300mg  XR of wellbutrin. I confirmed with pharmacy. Informed patient to take 300mg  and 100mg  (1 tablet each. Total of 400mg ).

## 2014-06-04 ENCOUNTER — Telehealth (HOSPITAL_COMMUNITY): Payer: Self-pay

## 2014-06-14 ENCOUNTER — Ambulatory Visit (HOSPITAL_COMMUNITY): Payer: Self-pay | Admitting: Psychiatry

## 2018-02-17 DIAGNOSIS — M25511 Pain in right shoulder: Secondary | ICD-10-CM | POA: Diagnosis not present

## 2018-02-17 DIAGNOSIS — R6889 Other general symptoms and signs: Secondary | ICD-10-CM | POA: Diagnosis not present

## 2018-02-24 DIAGNOSIS — E559 Vitamin D deficiency, unspecified: Secondary | ICD-10-CM | POA: Diagnosis not present

## 2018-02-24 DIAGNOSIS — E039 Hypothyroidism, unspecified: Secondary | ICD-10-CM | POA: Diagnosis not present

## 2018-02-24 DIAGNOSIS — E7800 Pure hypercholesterolemia, unspecified: Secondary | ICD-10-CM | POA: Diagnosis not present

## 2018-02-24 DIAGNOSIS — M8580 Other specified disorders of bone density and structure, unspecified site: Secondary | ICD-10-CM | POA: Diagnosis not present

## 2018-02-24 DIAGNOSIS — G479 Sleep disorder, unspecified: Secondary | ICD-10-CM | POA: Diagnosis not present

## 2018-03-04 DIAGNOSIS — M25511 Pain in right shoulder: Secondary | ICD-10-CM | POA: Diagnosis not present

## 2018-03-09 DIAGNOSIS — M25511 Pain in right shoulder: Secondary | ICD-10-CM | POA: Diagnosis not present

## 2018-03-25 ENCOUNTER — Emergency Department (INDEPENDENT_AMBULATORY_CARE_PROVIDER_SITE_OTHER): Payer: BLUE CROSS/BLUE SHIELD

## 2018-03-25 ENCOUNTER — Encounter: Payer: Self-pay | Admitting: Emergency Medicine

## 2018-03-25 ENCOUNTER — Other Ambulatory Visit: Payer: Self-pay

## 2018-03-25 ENCOUNTER — Emergency Department (INDEPENDENT_AMBULATORY_CARE_PROVIDER_SITE_OTHER)
Admission: EM | Admit: 2018-03-25 | Discharge: 2018-03-25 | Disposition: A | Discharge status: Home / Self Care | Payer: Medicare Other | Source: Home / Self Care

## 2018-03-25 DIAGNOSIS — M25561 Pain in right knee: Secondary | ICD-10-CM

## 2018-03-25 HISTORY — DX: Insomnia, unspecified: G47.00

## 2018-03-25 HISTORY — DX: Major depressive disorder, single episode, unspecified: F32.9

## 2018-03-25 HISTORY — DX: Sjogren syndrome, unspecified: M35.00

## 2018-03-25 HISTORY — DX: Anxiety disorder, unspecified: F41.9

## 2018-03-25 HISTORY — DX: Essential tremor: G25.0

## 2018-03-25 HISTORY — DX: Depression, unspecified: F32.A

## 2018-03-25 HISTORY — DX: Disorder of thyroid, unspecified: E07.9

## 2018-03-25 HISTORY — DX: Endocarditis, valve unspecified: I38

## 2018-03-25 MED ORDER — DICLOFENAC SODIUM 75 MG PO TBEC: 75.0000 mg | DELAYED_RELEASE_TABLET | Freq: Two times a day (BID) | ORAL | 2 refills | Status: AC

## 2018-03-25 NOTE — ED Triage Notes (Signed)
Patient fell forward in kitchen 2 nights ago; during fall hit left cheek on refrigerator; top of head was hit by falling vase; landed with most of weight on right knee. Has used ice; not very painful when sitting, but severe pain when standing/walking. Did drive herself to [facility] today. No OTCs or rxs for pain.

## 2018-03-25 NOTE — ED Provider Notes (Signed)
[facility]   [id number] 03/25/18 Arrival Time: 1657   SUBJECTIVE:  Tamara Cochran is a 62 y.o. female who presents to the urgent care with complaint of right knee pain with weightbearing.  Patient fell 2 nights ago on kitchen floor when she was carrying an water bottle to the refrigerator.  She is unsure why she fell.  She was able to get back on her feet after a few minutes.  She realized she had struck her left cheek on the refrigerator door in the face that was on top of her refrigerator landed on her head.  There was no loss of consciousness.  She still little tender over the left cheek.  She also has some tenderness in the left knee but it is not nearly as significant as what she is experiencing in her right knee.  There is been no swelling in the right knee.  She has pain when she bends it or bears weight.  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  She is contemplating a right total shoulder replacement.  Patient drove herself over here and walked in.   Past Medical History:  Diagnosis Date  . Anxiety and depression   . Benign essential tremor   . Heart valve problem   . Hyperlipidemia   . Hyperlipidemia   . Insomnia   . Sjogren's disease (HCC)   . Thyroid disease    No family history on file. Social History   Socioeconomic History  . Marital status: Divorced    Spouse name: Not on file  . Number of children: Not on file  . Years of education: Not on file  . Highest education level: Not on file  Occupational History  . Not on file  Social Needs  . Financial resource strain: Not on file  . Food insecurity:    Worry: Not on file    Inability: Not on file  . Transportation needs:    Medical: Not on file    Non-medical: Not on file  Tobacco Use  . Smoking status: Never Smoker  . Smokeless tobacco: Never Used  Substance and Sexual Activity  . Alcohol use: Yes  . Drug use: Not on file  .  Sexual activity: Not on file  Lifestyle  . Physical activity:    Days per week: Not on file    Minutes per session: Not on file  . Stress: Not on file  Relationships  . Social connections:    Talks on phone: Not on file    Gets together: Not on file    Attends religious service: Not on file    Active member of club or organization: Not on file    Attends meetings of clubs or organizations: Not on file    Relationship status: Not on file  . Intimate partner violence:    Fear of current or ex partner: Not on file    Emotionally abused: Not on file    Physically abused: Not on file    Forced sexual activity: Not on file  Other Topics Concern  . Not on file  Social History Narrative  . Not on file   Current Meds  Medication Sig  . Calcium Carbonate-Vitamin D 500-125 MG-UNIT TABS Take 1 tablet by mouth daily.  . cycloSPORINE (RESTASIS) 0.05 % ophthalmic emulsion 1 drop 2 (two) times daily.  Marland Kitchen levothyroxine (SYNTHROID, LEVOTHROID) 50 MCG tablet Take 50 mcg by mouth daily before breakfast.  .  propranolol ER (INDERAL LA) 60 MG 24 hr capsule Take 60 mg by mouth daily.  . traZODone (DESYREL) 50 MG tablet Take 25 mg by mouth at bedtime as needed for sleep.   No Known Allergies    ROS: As per HPI, remainder of ROS negative.   OBJECTIVE:   Vitals:   03/25/18 1725 03/25/18 1727  BP: 119/86   Pulse: 69   Resp: 18   Temp: 98.4 F (36.9 C)   TempSrc: Oral   SpO2: 99%   Weight:  122 lb (55.3 kg)  Height:  5\' 4"  (1.626 m)     General appearance: alert; no distress Eyes: PERRL; EOMI; conjunctiva normal HENT: normocephalic with no evidence of head trauma; (atraumatic); TMs normal, canal normal, external ears normal without trauma; nasal mucosa normal; oral mucosa normal Neck: supple Back: no CVA tenderness Extremities: no cyanosis or edema; symmetrical with no gross deformities Patient sitting in a wheelchair unable to bend her right knee.  There is no effusion and there is no  tenderness other than over the patella.  There is no ligamentous laxity.  There is no ecchymosis. Left knee shows no swelling, ecchymosis, or effusion.  She has good range of motion and is only mildly tender over the medial aspect of the left patella. Patient has a frozen right shoulder which is pre-existing Skin: warm and dry Neurologic: normal gait; grossly normal Psychological: alert and cooperative; normal mood and affect   Result Date: 03/25/2018 CLINICAL DATA:  Pain following fall EXAM: RIGHT KNEE - COMPLETE 4+ VIEW COMPARISON:  None. FINDINGS: Frontal, lateral, and bilateral oblique views were obtained. No fracture or dislocation. No joint effusion. Joint spaces appear normal. No erosive change. IMPRESSION: No fracture or joint effusion.  No evident arthropathy. Electronically Signed   By: Bretta BangWilliam  Woodruff III M.D.   On: 03/25/2018 18:04        ASSESSMENT & PLAN:  1. Acute pain of right knee     Meds ordered this encounter  Medications  . diclofenac (VOLTAREN) 75 MG EC tablet    Sig: Take 1 tablet (75 mg total) by mouth 2 (two) times daily.    Dispense:  14 tablet    Refill:  2    Reviewed expectations re: course of current medical issues. Questions answered. Outlined signs and symptoms indicating need for more acute intervention. Patient verbalized understanding. After Visit Summary given.    Procedures:      Elvina SidleLauenstein, Dashae Wilcher, MD 03/28/18 1721

## 2018-03-25 NOTE — Discharge Instructions (Addendum)
The preliminary reading on the x-ray is that there is no fracture.   You have bruised the patella (knee cap) and should do better with the knee immobilizer for 5-7 days.  X-ray reading: Result Date: 03/25/2018 CLINICAL DATA:  Pain following fall EXAM: RIGHT KNEE - COMPLETE 4+ VIEW COMPARISON:  None. FINDINGS: Frontal, lateral, and bilateral oblique views were obtained. No fracture or dislocation. No joint effusion. Joint spaces appear normal. No erosive change. IMPRESSION: No fracture or joint effusion.  No evident arthropathy. Electronically Signed   By: Bretta BangWilliam  Woodruff III M.D.   On: 03/25/2018 18:04

## 2018-03-27 ENCOUNTER — Telehealth: Payer: Self-pay | Admitting: Emergency Medicine

## 2018-03-27 NOTE — Telephone Encounter (Signed)
Message left on voice mail inquiring about patient's status and encouraging patient to call with questions/concerns.  

## 2018-05-11 DIAGNOSIS — M25511 Pain in right shoulder: Secondary | ICD-10-CM | POA: Diagnosis not present

## 2018-08-09 DIAGNOSIS — I082 Rheumatic disorders of both aortic and tricuspid valves: Secondary | ICD-10-CM | POA: Diagnosis not present

## 2018-08-10 DIAGNOSIS — M25511 Pain in right shoulder: Secondary | ICD-10-CM | POA: Diagnosis not present

## 2018-08-25 DIAGNOSIS — E7800 Pure hypercholesterolemia, unspecified: Secondary | ICD-10-CM | POA: Diagnosis not present

## 2018-08-25 DIAGNOSIS — R002 Palpitations: Secondary | ICD-10-CM | POA: Diagnosis not present

## 2018-08-25 DIAGNOSIS — R079 Chest pain, unspecified: Secondary | ICD-10-CM | POA: Diagnosis not present

## 2018-08-25 DIAGNOSIS — I351 Nonrheumatic aortic (valve) insufficiency: Secondary | ICD-10-CM | POA: Diagnosis not present

## 2018-08-25 DIAGNOSIS — R739 Hyperglycemia, unspecified: Secondary | ICD-10-CM | POA: Diagnosis not present

## 2018-08-25 DIAGNOSIS — R0902 Hypoxemia: Secondary | ICD-10-CM | POA: Diagnosis not present

## 2018-08-25 DIAGNOSIS — R0602 Shortness of breath: Secondary | ICD-10-CM | POA: Diagnosis not present

## 2018-08-25 DIAGNOSIS — R251 Tremor, unspecified: Secondary | ICD-10-CM | POA: Diagnosis not present

## 2018-08-25 DIAGNOSIS — R5383 Other fatigue: Secondary | ICD-10-CM | POA: Diagnosis not present

## 2018-08-25 DIAGNOSIS — E039 Hypothyroidism, unspecified: Secondary | ICD-10-CM | POA: Diagnosis not present

## 2018-08-29 DIAGNOSIS — Z23 Encounter for immunization: Secondary | ICD-10-CM | POA: Diagnosis not present

## 2018-11-16 DIAGNOSIS — M25511 Pain in right shoulder: Secondary | ICD-10-CM | POA: Diagnosis not present

## 2018-12-20 DIAGNOSIS — G479 Sleep disorder, unspecified: Secondary | ICD-10-CM | POA: Diagnosis not present

## 2018-12-20 DIAGNOSIS — E559 Vitamin D deficiency, unspecified: Secondary | ICD-10-CM | POA: Diagnosis not present

## 2018-12-20 DIAGNOSIS — E7800 Pure hypercholesterolemia, unspecified: Secondary | ICD-10-CM | POA: Diagnosis not present

## 2018-12-20 DIAGNOSIS — E039 Hypothyroidism, unspecified: Secondary | ICD-10-CM | POA: Diagnosis not present

## 2018-12-20 DIAGNOSIS — Z0000 Encounter for general adult medical examination without abnormal findings: Secondary | ICD-10-CM | POA: Diagnosis not present

## 2018-12-20 DIAGNOSIS — Z1231 Encounter for screening mammogram for malignant neoplasm of breast: Secondary | ICD-10-CM | POA: Diagnosis not present

## 2019-02-15 DIAGNOSIS — Z13820 Encounter for screening for osteoporosis: Secondary | ICD-10-CM | POA: Diagnosis not present

## 2019-02-15 DIAGNOSIS — M85852 Other specified disorders of bone density and structure, left thigh: Secondary | ICD-10-CM | POA: Diagnosis not present

## 2019-02-15 DIAGNOSIS — Z780 Asymptomatic menopausal state: Secondary | ICD-10-CM | POA: Diagnosis not present

## 2019-02-15 DIAGNOSIS — Z1231 Encounter for screening mammogram for malignant neoplasm of breast: Secondary | ICD-10-CM | POA: Diagnosis not present

## 2019-02-15 DIAGNOSIS — M8588 Other specified disorders of bone density and structure, other site: Secondary | ICD-10-CM | POA: Diagnosis not present

## 2019-03-22 DIAGNOSIS — M899 Disorder of bone, unspecified: Secondary | ICD-10-CM | POA: Diagnosis not present

## 2019-03-22 DIAGNOSIS — R5383 Other fatigue: Secondary | ICD-10-CM | POA: Diagnosis not present

## 2019-03-22 DIAGNOSIS — E7800 Pure hypercholesterolemia, unspecified: Secondary | ICD-10-CM | POA: Diagnosis not present

## 2019-03-22 DIAGNOSIS — M8580 Other specified disorders of bone density and structure, unspecified site: Secondary | ICD-10-CM | POA: Diagnosis not present

## 2019-03-22 DIAGNOSIS — E559 Vitamin D deficiency, unspecified: Secondary | ICD-10-CM | POA: Diagnosis not present

## 2019-06-16 ENCOUNTER — Other Ambulatory Visit: Payer: Self-pay

## 2019-06-16 ENCOUNTER — Encounter: Payer: Self-pay | Admitting: Emergency Medicine

## 2019-06-16 ENCOUNTER — Emergency Department (INDEPENDENT_AMBULATORY_CARE_PROVIDER_SITE_OTHER): Payer: Medicare Other

## 2019-06-16 ENCOUNTER — Emergency Department (INDEPENDENT_AMBULATORY_CARE_PROVIDER_SITE_OTHER)
Admission: EM | Admit: 2019-06-16 | Discharge: 2019-06-16 | Disposition: A | Discharge status: Home / Self Care | Payer: Medicare Other | Source: Home / Self Care | Attending: Family Medicine | Admitting: Family Medicine

## 2019-06-16 DIAGNOSIS — I952 Hypotension due to drugs: Secondary | ICD-10-CM | POA: Diagnosis not present

## 2019-06-16 DIAGNOSIS — M542 Cervicalgia: Secondary | ICD-10-CM | POA: Diagnosis not present

## 2019-06-16 DIAGNOSIS — G8929 Other chronic pain: Secondary | ICD-10-CM | POA: Diagnosis not present

## 2019-06-16 DIAGNOSIS — M47892 Other spondylosis, cervical region: Secondary | ICD-10-CM

## 2019-06-16 NOTE — ED Triage Notes (Signed)
Patient went to donate blood yesterday and her BP was 80/40, she was sent home and told to see her doctor, she is asymptomatic.

## 2019-06-16 NOTE — Discharge Instructions (Signed)
Stay well hydrated.  Try taking your Inderal at bedtime.

## 2019-06-16 NOTE — ED Provider Notes (Signed)
[facility]    CSN: [id number] Arrival date & time: 06/16/19  1309     History   Chief Complaint Chief Complaint  Patient presents with  . Hypotension    HPI Tamara Cochran is a 63 y.o. female.   Patient presents with two complaints: 1)  She reports that she attempted to donate blood yesterday.  Her BP at that time was 80/40, and she was advised to follow-up with her PCP.  She was assymptomatic at that time, and denies light-headedness or dizziness.  She takes Inderal LA 60 for benign essential tremor, and admits that she took her medication later than usual yesterday. 2)  She complains of 6 month history of vague soreness in her posterior neck and upper back, without radicular symptoms.  The pain is worse with neck movement.  She denies history of neck injury.  The history is provided by the patient.    Past Medical History:  Diagnosis Date  . Anxiety and depression   . Benign essential tremor   . Heart valve problem   . Hyperlipidemia   . Hyperlipidemia   . Insomnia   . Sjogren's disease ([place])   . Thyroid disease     Patient Active Problem List   Diagnosis Date Noted  . Generalized anxiety disorder 01/24/2014  . Depression, major, single episode, moderate ([place]) 01/24/2014    Past Surgical History:  Procedure Laterality Date  . KNEE SURGERY         Home Medications    Prior to Admission medications   Medication Sig Start Date End Date Taking? Authorizing Provider  busPIRone (BUSPAR) 10 MG tablet Take 10 mg by mouth 3 (three) times daily.   Yes [provider]  Calcium Carbonate-Vitamin D 500-125 MG-UNIT TABS Take 1 tablet by mouth daily.    [provider]  cycloSPORINE (RESTASIS) 0.05 % ophthalmic emulsion 1 drop 2 (two) times daily.    [provider]  diclofenac (VOLTAREN) 75 MG EC tablet Take 1 tablet (75 mg total) by mouth 2 (two) times daily. 03/25/18   Robyn Haber, MD  levothyroxine (SYNTHROID, LEVOTHROID)  50 MCG tablet Take 50 mcg by mouth daily before breakfast.    [provider]  propranolol ER (INDERAL LA) 60 MG 24 hr capsule Take 60 mg by mouth daily.    [provider]  traZODone (DESYREL) 50 MG tablet Take 25 mg by mouth at bedtime as needed for sleep.    [provider]  venlafaxine (EFFEXOR) 25 MG tablet Take 1 tablet (25 mg total) by mouth 2 (two) times daily. 05/15/14   Merian Capron, MD    Family History Heart disease mother.  Social History Social History   Tobacco Use  . Smoking status: Never Smoker  . Smokeless tobacco: Never Used  Substance Use Topics  . Alcohol use: Yes  . Drug use: Not on file     Allergies   Patient has no known allergies.   Review of Systems Review of Systems  Constitutional: Negative for activity change, appetite change, chills, diaphoresis, fatigue, fever and unexpected weight change.  HENT: Negative.   Eyes: Negative.   Respiratory: Negative.   Cardiovascular: Negative.   Gastrointestinal: Negative.   Genitourinary: Negative.   Musculoskeletal: Positive for neck pain.  Skin: Negative.   Neurological: Positive for tremors. Negative for dizziness, syncope, speech difficulty, weakness, light-headedness, numbness and headaches.     Physical Exam Triage Vital Signs ED Triage Vitals  Enc Vitals Group  BP 06/16/19 1523 111/74     Pulse Rate 06/16/19 1523 65     Resp --      Temp 06/16/19 1523 98.2 F (36.8 C)     Temp Source 06/16/19 1523 Oral     SpO2 06/16/19 1523 99 %     Weight 06/16/19 1524 122 lb (55.3 kg)     Height 06/16/19 1524 5\' 4"  (1.626 m)     Head Circumference --      Peak Flow --      Pain Score 06/16/19 1524 0     Pain Loc --      Pain Edu? --      Excl. in GC? --    Orthostatic VS for the past 24 hrs:  BP- Lying Pulse- Lying BP- Sitting Pulse- Sitting BP- Standing at 0 minutes Pulse- Standing at 0 minutes  06/16/19 1638 124/82 58 116/82 56 100/72 58    Updated Vital Signs  BP 111/74 (BP Location: Left Arm)   Pulse 65   Temp 98.2 F (36.8 C) (Oral)   Ht 5\' 4"  (1.626 m)   Wt 55.3 kg   SpO2 99%   BMI 20.94 kg/m   Visual Acuity Right Eye Distance:   Left Eye Distance:   Bilateral Distance:    Right Eye Near:   Left Eye Near:    Bilateral Near:     Physical Exam Nursing notes and Vital Signs reviewed. Appearance:  Patient appears stated age, and in no acute distress.    Eyes:  Pupils are equal, round, and reactive to light and accomodation.  Extraocular movement is intact.  Conjunctivae are not inflamed   Pharynx:  Normal; moist mucous membranes  Neck:  Supple.  No adenopathy.  Mild tenderness to palpation over trapezius muscles bilaterally. Lungs:  Clear to auscultation.  Breath sounds are equal.  Moving air well. Heart:  Regular rate and rhythm without murmurs, rubs, or gallops.  Abdomen:  Nontender without masses or hepatosplenomegaly.  Bowel sounds are present.  No CVA or flank tenderness.  Extremities:  No edema.  Skin:  No rash present.     UC Treatments / Results  Labs (all labs ordered are listed, but only abnormal results are displayed) Labs Reviewed - No data to display  EKG   Radiology Dg Cervical Spine Complete  Result Date: 06/16/2019 CLINICAL DATA:  Chronic neck pain worsened over the last month. EXAM: CERVICAL SPINE - COMPLETE 4+ VIEW COMPARISON:  None. FINDINGS: No fracture or traumatic malalignment. The pre odontoid space and prevertebral soft tissues are normal. Neural foramina are patent. Mild degenerative changes are identified most prominent at C4-5. The lateral masses of C1 align with C2. The odontoid process is unremarkable. The lung apices are normal. IMPRESSION: Mild degenerative changes.  No other acute abnormalities. Electronically Signed   By: Gerome Samavid  Williams III M.D   On: 06/16/2019 16:39    Procedures Procedures (including critical care time)  Medications Ordered in UC Medications - No data to display  Initial  Impression / Assessment and Plan / UC Course  I have reviewed the triage vital signs and the nursing notes.  Pertinent labs & imaging results that were available during my care of the patient were reviewed by me and considered in my medical decision making (see chart for details).    Note minimal change in heart rate with changes in position.  Suspect propranolol cause of patient's low blood pressure today. Try ibuprofen for persistent neck pain. Followup with  Family Doctor if symptoms persist.   Final Clinical Impressions(s) / UC Diagnoses   Final diagnoses:  Hypotension due to drugs  Other osteoarthritis of spine, cervical region     Discharge Instructions     Stay well hydrated.  Try taking your Inderal at bedtime.    ED Prescriptions    None        Lattie HawBeese, Davionte Lusby A, MD 06/19/19 1745

## 2019-06-29 DIAGNOSIS — E7800 Pure hypercholesterolemia, unspecified: Secondary | ICD-10-CM | POA: Diagnosis not present

## 2019-06-29 DIAGNOSIS — R7989 Other specified abnormal findings of blood chemistry: Secondary | ICD-10-CM | POA: Diagnosis not present

## 2019-06-29 DIAGNOSIS — G479 Sleep disorder, unspecified: Secondary | ICD-10-CM | POA: Diagnosis not present

## 2019-06-29 DIAGNOSIS — E039 Hypothyroidism, unspecified: Secondary | ICD-10-CM | POA: Diagnosis not present

## 2019-06-29 DIAGNOSIS — F338 Other recurrent depressive disorders: Secondary | ICD-10-CM | POA: Diagnosis not present

## 2019-07-10 DIAGNOSIS — M25511 Pain in right shoulder: Secondary | ICD-10-CM | POA: Diagnosis not present

## 2020-03-06 DIAGNOSIS — Z1231 Encounter for screening mammogram for malignant neoplasm of breast: Secondary | ICD-10-CM | POA: Diagnosis not present

## 2020-03-11 DIAGNOSIS — M8580 Other specified disorders of bone density and structure, unspecified site: Secondary | ICD-10-CM | POA: Diagnosis not present

## 2020-03-11 DIAGNOSIS — E559 Vitamin D deficiency, unspecified: Secondary | ICD-10-CM | POA: Diagnosis not present

## 2020-03-11 DIAGNOSIS — E7800 Pure hypercholesterolemia, unspecified: Secondary | ICD-10-CM | POA: Diagnosis not present

## 2020-03-11 DIAGNOSIS — Z0000 Encounter for general adult medical examination without abnormal findings: Secondary | ICD-10-CM | POA: Diagnosis not present

## 2020-03-11 DIAGNOSIS — R7989 Other specified abnormal findings of blood chemistry: Secondary | ICD-10-CM | POA: Diagnosis not present

## 2020-03-11 DIAGNOSIS — G43909 Migraine, unspecified, not intractable, without status migrainosus: Secondary | ICD-10-CM | POA: Diagnosis not present

## 2020-03-11 DIAGNOSIS — E039 Hypothyroidism, unspecified: Secondary | ICD-10-CM | POA: Diagnosis not present

## 2020-03-11 DIAGNOSIS — R5383 Other fatigue: Secondary | ICD-10-CM | POA: Diagnosis not present

## 2021-04-11 IMAGING — DX CERVICAL SPINE - COMPLETE 4+ VIEW
6 series · 6 of 6 positions shown · non-contrast
Comparison: None.

CLINICAL DATA: Chronic neck pain worsened over the last month.

EXAM:
CERVICAL SPINE - COMPLETE 4+ VIEW

[c-spine lat]
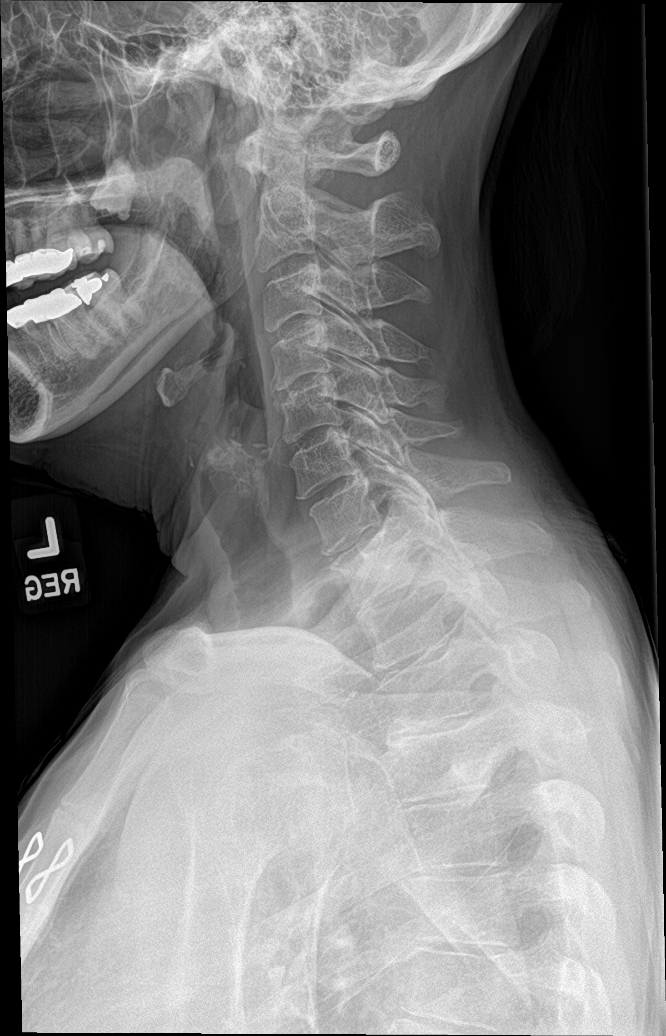

[c-spine obl (1 of 2)]
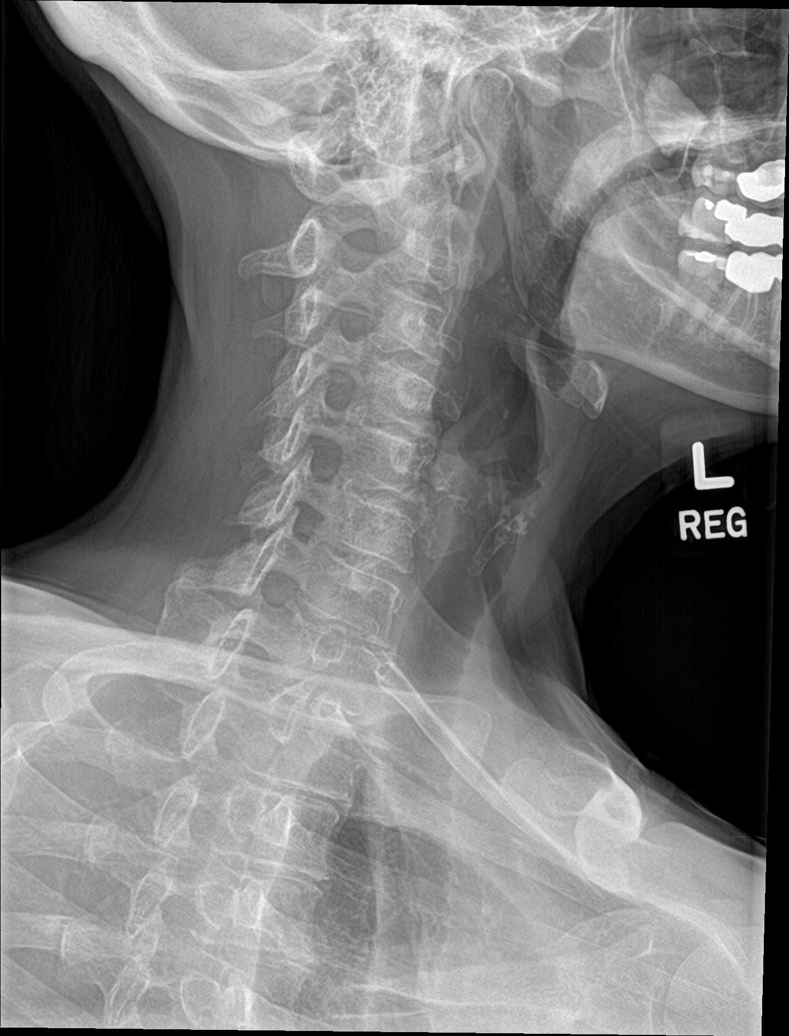

[c-spine obl (2 of 2)]
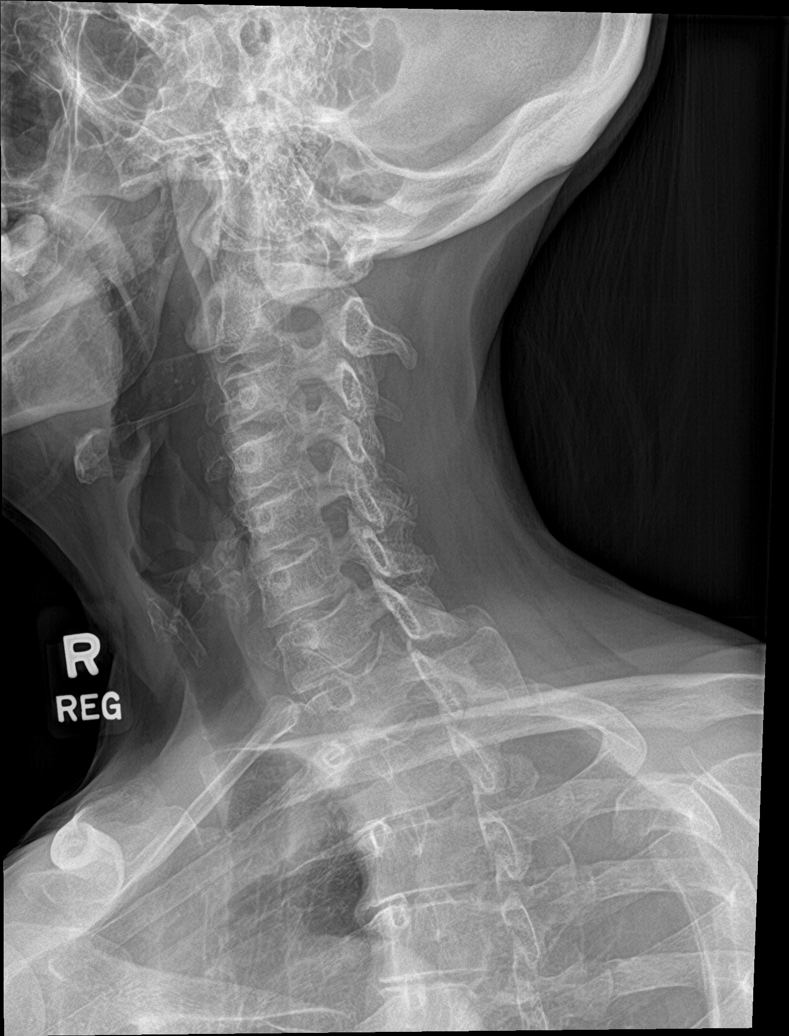

[c-spine ap]
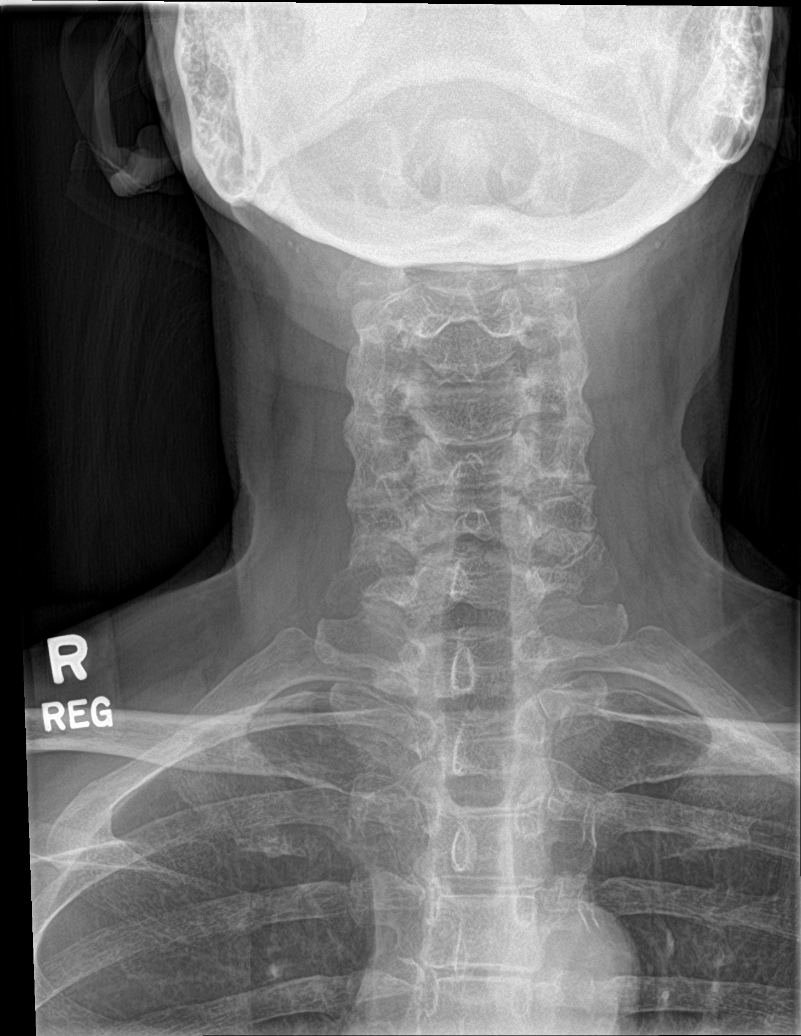

[c-spine open mouth]
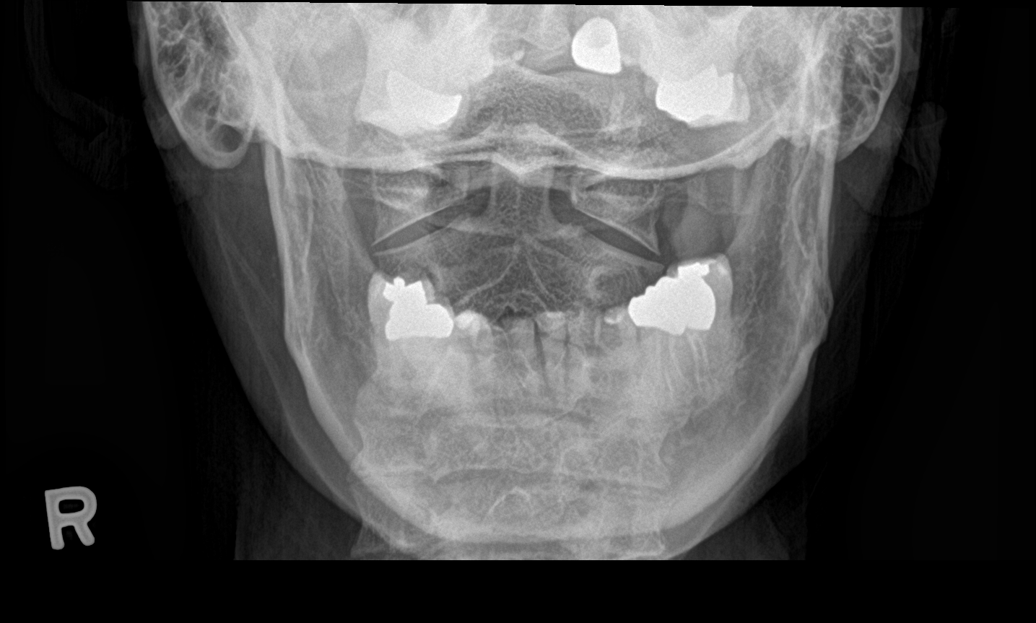

[c-spine swimmers]
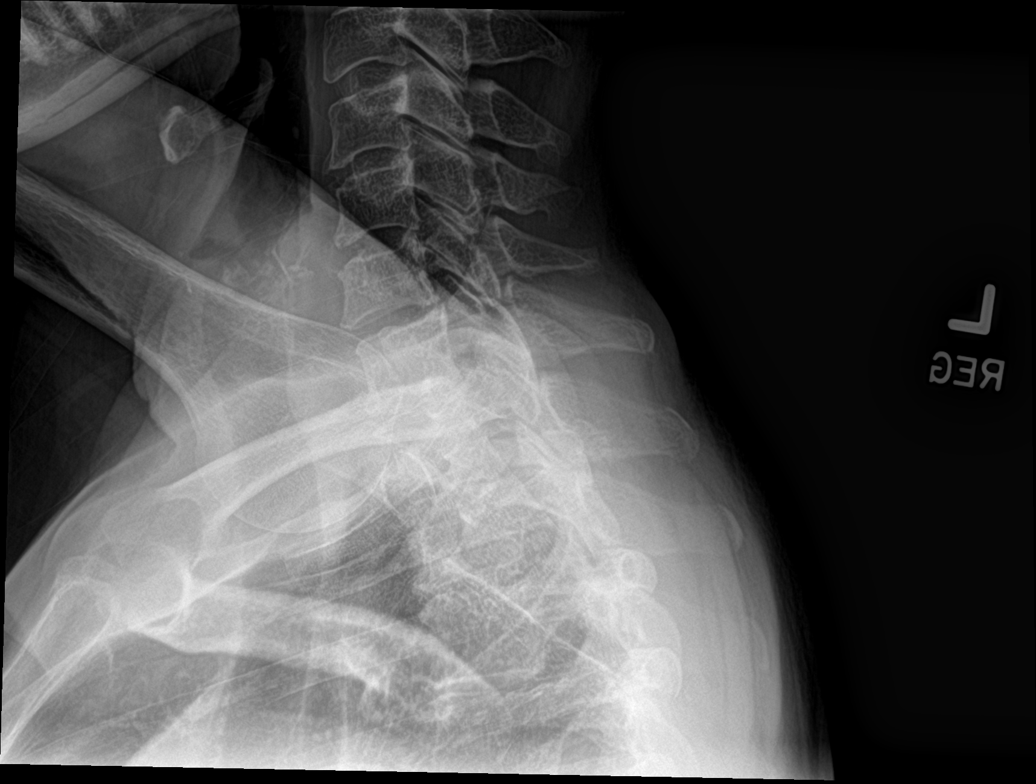

[6 of 6 positions shown; findings below may reference images not displayed]

FINDINGS: No fracture or traumatic malalignment. The pre odontoid space and
prevertebral soft tissues are normal. Neural foramina are patent.
Mild degenerative changes are identified most prominent at C4-5. The
lateral masses of C1 align with C2. The odontoid process is
unremarkable. The lung apices are normal.
IMPRESSION: Mild degenerative changes.  No other acute abnormalities.
